# Patient Record
Sex: Male | Born: 1980 | Race: Black or African American | Hispanic: No | Smoking: Current every day smoker
Health system: Southern US, Community
[De-identification: ages and names within clinical notes are randomized; demographics above are authoritative.]

## PROBLEM LIST (undated history)

## (undated) DIAGNOSIS — J40 Bronchitis, not specified as acute or chronic: Secondary | ICD-10-CM

---

## 2001-11-14 ENCOUNTER — Emergency Department (HOSPITAL_COMMUNITY): Admission: EM | Admit: 2001-11-14 | Discharge: 2001-11-15 | Payer: Self-pay | Admitting: *Deleted

## 2001-11-15 ENCOUNTER — Encounter: Payer: Self-pay | Admitting: *Deleted

## 2001-11-15 ENCOUNTER — Ambulatory Visit (HOSPITAL_COMMUNITY): Admission: RE | Admit: 2001-11-15 | Discharge: 2001-11-15 | Payer: Self-pay | Admitting: Emergency Medicine

## 2001-11-15 ENCOUNTER — Encounter: Payer: Self-pay | Admitting: Emergency Medicine

## 2015-08-15 ENCOUNTER — Emergency Department (HOSPITAL_COMMUNITY)
Admission: EM | Admit: 2015-08-15 | Discharge: 2015-08-15 | Disposition: A | Discharge status: Home / Self Care | Payer: Self-pay | Attending: Emergency Medicine | Admitting: Emergency Medicine

## 2015-08-15 ENCOUNTER — Encounter (HOSPITAL_COMMUNITY): Payer: Self-pay | Admitting: Emergency Medicine

## 2015-08-15 DIAGNOSIS — A6000 Herpesviral infection of urogenital system, unspecified: Secondary | ICD-10-CM | POA: Insufficient documentation

## 2015-08-15 DIAGNOSIS — Z720 Tobacco use: Secondary | ICD-10-CM | POA: Insufficient documentation

## 2015-08-15 MED ORDER — ACYCLOVIR 400 MG PO TABS: 400.0000 mg | ORAL_TABLET | Freq: Three times a day (TID) | ORAL | Status: AC

## 2015-08-15 MED ORDER — TRAMADOL HCL 50 MG PO TABS: 50.0000 mg | ORAL_TABLET | Freq: Four times a day (QID) | ORAL | Status: DC | PRN

## 2015-08-15 MED ORDER — IBUPROFEN 600 MG PO TABS: 600.0000 mg | ORAL_TABLET | Freq: Four times a day (QID) | ORAL | Status: DC | PRN

## 2015-08-15 NOTE — ED Notes (Signed)
Pt. requesting STD screening reports painful skin "sores" at penis onset 3 days ago , denies dysuria or penile discharge.

## 2015-08-15 NOTE — ED Provider Notes (Signed)
CSN: [id number]     Arrival date & time 08/15/15  0110 History   First MD Initiated Contact with Patient 08/15/15 0129     Chief Complaint  Patient presents with  . Exposure to STD     (Consider location/radiation/quality/duration/timing/severity/associated sxs/prior Treatment) HPI Patient presents with painful lesions to the penis for the past 3 days. States they appeared to days after having unprotected sex with a new sexual partner. Initially started as blisters and then became ulcerated. Denies any fever or chills. No testicular pain or masses. No penile discharge. Denies any abdominal pain. No dysuria, frequency, hematuria or urgency. No previously diagnosed sexually transmitted diseases. History reviewed. No pertinent past medical history. No past surgical history on file. No family history on file. Social History  Substance Use Topics  . Smoking status: Current Every Day Smoker  . Smokeless tobacco: None  . Alcohol Use: Yes    Review of Systems  Constitutional: Negative for fever and chills.  Gastrointestinal: Negative for nausea, vomiting and abdominal pain.  Genitourinary: Positive for penile pain. Negative for dysuria, frequency, hematuria, flank pain, discharge, scrotal swelling and testicular pain.  Skin: Positive for rash.  All other systems reviewed and are negative.     Allergies  Review of patient's allergies indicates no known allergies.  Home Medications   Prior to Admission medications   Medication Sig Start Date End Date Taking? Authorizing Provider  acyclovir (ZOVIRAX) 400 MG tablet Take 1 tablet (400 mg total) by mouth 3 (three) times daily. 08/15/15 08/25/15  Loren Racer, MD  ibuprofen (ADVIL,MOTRIN) 600 MG tablet Take 1 tablet (600 mg total) by mouth every 6 (six) hours as needed. 08/15/15   Loren Racer, MD  traMADol (ULTRAM) 50 MG tablet Take 1 tablet (50 mg total) by mouth every 6 (six) hours as needed. 08/15/15   Loren Racer, MD   BP  130/78 mmHg  Pulse 82  Temp(Src) 98.3 F (36.8 C) (Oral)  Resp 14  SpO2 97% Physical Exam  Constitutional: He is oriented to person, place, and time. He appears well-developed and well-nourished. No distress.  HENT:  Head: Normocephalic and atraumatic.  Mouth/Throat: Oropharynx is clear and moist.  Eyes: EOM are normal. Pupils are equal, round, and reactive to light.  Neck: Normal range of motion. Neck supple.  Cardiovascular: Normal rate and regular rhythm.   Pulmonary/Chest: Effort normal and breath sounds normal.  Abdominal: Soft. Bowel sounds are normal. He exhibits no distension and no mass. There is no tenderness. There is no rebound and no guarding.  Genitourinary:  Circumcised male with multiple ulcerated lesions to the glans and shaft of the penis. There is no penile discharge. No testicular masses or tenderness.  Musculoskeletal: Normal range of motion. He exhibits no edema or tenderness.  Neurological: He is alert and oriented to person, place, and time.  Skin: Skin is warm and dry. No rash noted. No erythema.  Psychiatric: He has a normal mood and affect. His behavior is normal.  Nursing note and vitals reviewed.   ED Course  Procedures (including critical care time) Labs Review Labs Reviewed - No data to display  Imaging Review No results found. I have personally reviewed and evaluated these images and lab results as part of my medical decision-making.   EKG Interpretation None      MDM   Final diagnoses:  Herpes genitalis    Physical exam consistent with herpes genitalis. Will start on acyclovir. He is advised to follow-up with health Department and have  all sexual partners seen and treated.    Loren Racer, MD 08/15/15 [phone]

## 2015-08-15 NOTE — Discharge Instructions (Signed)
Genital Herpes °Genital herpes is a sexually transmitted disease. This means that it is a disease passed by having sex with an infected person. There is no cure for genital herpes. The time between attacks can be months to years. The virus may live in a person but produce no problems (symptoms). This infection can be passed to a baby as it travels down the birth canal (vagina). In a newborn, this can cause central nervous system damage, eye damage, or even death. The virus that causes genital herpes is usually HSV-2 virus. The virus that causes oral herpes is usually HSV-1. The diagnosis (learning what is wrong) is made through culture results. °SYMPTOMS  °Usually symptoms of pain and itching begin a few days to a week after contact. It first appears as small blisters that progress to small painful ulcers which then scab over and heal after several days. It affects the outer genitalia, birth canal, cervix, penis, anal area, buttocks, and thighs. °HOME CARE INSTRUCTIONS  °· Keep ulcerated areas dry and clean. °· Take medications as directed. Antiviral medications can speed up healing. They will not prevent recurrences or cure this infection. These medications can also be taken for suppression if there are frequent recurrences. °· While the infection is active, it is contagious. Avoid all sexual contact during active infections. °· Condoms may help prevent spread of the herpes virus. °· Practice safe sex. °· Wash your hands thoroughly after touching the genital area. °· Avoid touching your eyes after touching your genital area. °· Inform your caregiver if you have had genital herpes and become pregnant. It is your responsibility to insure a safe outcome for your baby in this pregnancy. °· Only take over-the-counter or prescription medicines for pain, discomfort, or fever as directed by your caregiver. °SEEK MEDICAL CARE IF:  °· You have a recurrence of this infection. °· You do not respond to medications and are not  improving. °· You have new sources of pain or discharge which have changed from the original infection. °· You have an oral temperature above 102° F (38.9° C). °· You develop abdominal pain. °· You develop eye pain or signs of eye infection. °Document Released: 11/12/2000 Document Revised: 02/07/2012 Document Reviewed: 12/03/2009 °ExitCare® Patient Information ©2015 [employer]. This information is not intended to replace advice given to you by your health care provider. Make sure you discuss any questions you have with your health care provider. ° °

## 2016-04-21 ENCOUNTER — Encounter (HOSPITAL_COMMUNITY): Payer: Self-pay | Admitting: *Deleted

## 2016-04-21 ENCOUNTER — Emergency Department (HOSPITAL_COMMUNITY)
Admission: EM | Admit: 2016-04-21 | Discharge: 2016-04-21 | Disposition: A | Discharge status: Home / Self Care | Payer: Self-pay | Attending: Emergency Medicine | Admitting: Emergency Medicine

## 2016-04-21 DIAGNOSIS — Z791 Long term (current) use of non-steroidal anti-inflammatories (NSAID): Secondary | ICD-10-CM | POA: Insufficient documentation

## 2016-04-21 DIAGNOSIS — F17200 Nicotine dependence, unspecified, uncomplicated: Secondary | ICD-10-CM | POA: Insufficient documentation

## 2016-04-21 DIAGNOSIS — J020 Streptococcal pharyngitis: Secondary | ICD-10-CM | POA: Insufficient documentation

## 2016-04-21 LAB — RAPID STREP SCREEN (MED CTR MEBANE ONLY): Streptococcus, Group A Screen (Direct): POSITIVE — AB

## 2016-04-21 MED ORDER — AMOXICILLIN 250 MG PO CAPS
500.0000 mg | ORAL_CAPSULE | Freq: Once | ORAL | Status: AC
  Administered 2016-04-21: 500 mg via ORAL
  Filled 2016-04-21: qty 2

## 2016-04-21 MED ORDER — HYDROCODONE-ACETAMINOPHEN 5-325 MG PO TABS: 1.0000 | ORAL_TABLET | ORAL | Status: DC | PRN

## 2016-04-21 MED ORDER — ONDANSETRON HCL 4 MG PO TABS
4.0000 mg | ORAL_TABLET | Freq: Once | ORAL | Status: AC
  Administered 2016-04-21: 4 mg via ORAL
  Filled 2016-04-21: qty 1

## 2016-04-21 MED ORDER — AMOXICILLIN 500 MG PO CAPS: 500.0000 mg | ORAL_CAPSULE | Freq: Three times a day (TID) | ORAL | Status: DC

## 2016-04-21 MED ORDER — PREDNISONE 50 MG PO TABS
60.0000 mg | ORAL_TABLET | Freq: Once | ORAL | Status: AC
  Administered 2016-04-21: 60 mg via ORAL
  Filled 2016-04-21: qty 1

## 2016-04-21 MED ORDER — DEXAMETHASONE 4 MG PO TABS: 4.0000 mg | ORAL_TABLET | Freq: Two times a day (BID) | ORAL | Status: DC

## 2016-04-21 MED ORDER — HYDROCODONE-ACETAMINOPHEN 5-325 MG PO TABS
2.0000 | ORAL_TABLET | Freq: Once | ORAL | Status: AC
  Administered 2016-04-21: 2 via ORAL
  Filled 2016-04-21: qty 2

## 2016-04-21 NOTE — ED Provider Notes (Signed)
CSN: [id number]     Arrival date & time 04/21/16  1914 History   First MD Initiated Contact with Patient 04/21/16 2001     Chief Complaint  Patient presents with  . Sore Throat     (Consider location/radiation/quality/duration/timing/severity/associated sxs/prior Treatment) Patient is a 35 y.o. male presenting with pharyngitis. The history is provided by the patient.  Sore Throat This is a new problem. The current episode started in the past 7 days. The problem occurs intermittently. The problem has been gradually worsening. Associated symptoms include congestion, coughing, fatigue and a sore throat. Pertinent negatives include no rash. The symptoms are aggravated by swallowing. He has tried acetaminophen, ice and rest for the symptoms. The treatment provided no relief.    History reviewed. No pertinent past medical history. History reviewed. No pertinent past surgical history. History reviewed. No pertinent family history. Social History  Substance Use Topics  . Smoking status: Current Every Day Smoker -- 0.50 packs/day  . Smokeless tobacco: None  . Alcohol Use: Yes     Comment: occasionally    Review of Systems  Constitutional: Positive for appetite change and fatigue.  HENT: Positive for congestion and sore throat.   Respiratory: Positive for cough.   Skin: Negative for rash.  All other systems reviewed and are negative.     Allergies  Review of patient's allergies indicates no known allergies.  Home Medications   Prior to Admission medications   Medication Sig Start Date End Date Taking? Authorizing Provider  ibuprofen (ADVIL,MOTRIN) 600 MG tablet Take 1 tablet (600 mg total) by mouth every 6 (six) hours as needed. 08/15/15   Loren Racer, MD  traMADol (ULTRAM) 50 MG tablet Take 1 tablet (50 mg total) by mouth every 6 (six) hours as needed. 08/15/15   Loren Racer, MD   BP 126/68 mmHg  Pulse 70  Temp(Src) 98.6 F (37 C) (Oral)  Resp 18  Ht 6' (1.829 m)  Wt  89.812 kg  BMI 26.85 kg/m2  SpO2 98% Physical Exam  Constitutional: He is oriented to person, place, and time. He appears well-developed and well-nourished.  Non-toxic appearance.  HENT:  Head: Normocephalic.  Right Ear: Tympanic membrane and external ear normal.  Left Ear: Tympanic membrane and external ear normal.  The uvula is enlarged. There is increased redness of the posterior pharynx. The airway is patent.   Eyes: EOM and lids are normal. Pupils are equal, round, and reactive to light.  Neck: Normal range of motion. Neck supple. Carotid bruit is not present.  Cardiovascular: Normal rate, regular rhythm, normal heart sounds, intact distal pulses and normal pulses.   Pulmonary/Chest: Breath sounds normal. No respiratory distress.  Abdominal: Soft. Bowel sounds are normal. There is no tenderness. There is no guarding.  Musculoskeletal: Normal range of motion.  Lymphadenopathy:       Head (right side): No submandibular adenopathy present.       Head (left side): No submandibular adenopathy present.    He has no cervical adenopathy.  Neurological: He is alert and oriented to person, place, and time. He has normal strength. No cranial nerve deficit or sensory deficit.  Skin: Skin is warm and dry.  Psychiatric: He has a normal mood and affect. His speech is normal.  Nursing note and vitals reviewed.   ED Course  Procedures (including critical care time) Labs Review Labs Reviewed  RAPID STREP SCREEN (NOT AT [facility]) - Abnormal; Notable for the following:    Streptococcus, Group A Screen (Direct) POSITIVE (*)  All other components within normal limits    Imaging Review No results found. I have personally reviewed and evaluated these images and lab results as part of my medical decision-making.   EKG Interpretation None      MDM  Vital signs are well within normal limits. Pulse oximetry is 98% on room air. Strep test is positive. The patient was given a choice of treatment  with Bicillin versus oral medication. The patient elected for oral medication. Patient will be treated with Amoxil, Decadron, and Norco. Patient understands the need for good hand washing and using his mask until symptoms have resolved. Patient in agreement with the discharge plan.    Final diagnoses:  None    **I have reviewed nursing notes, vital signs, and all appropriate lab and imaging results for this patient.Ivery Quale*    Melony Tenpas, PA-C 04/24/16 2236  Bethann BerkshireJoseph Zammit, MD 04/25/16 [phone]

## 2016-04-21 NOTE — Discharge Instructions (Signed)
Your strep test is positive. Please use your mask. Wash hands frequently. Salt water gargles every 2 hours as needed. Chloraseptic spray may be helpful with your discomfort. Please use 600 mg of ibuprofen every 6 hours. Amoxil 3 times daily with food. Decadron 2 times daily with a meal. Use Norco for more severe pain. This medication may cause drowsiness, please use with caution. Strep Throat Strep throat is a bacterial infection of the throat. Your health care provider may call the infection tonsillitis or pharyngitis, depending on whether there is swelling in the tonsils or at the back of the throat. Strep throat is most common during the cold months of the year in children who are 365-35 years of age, but it can happen during any season in people of any age. This infection is spread from person to person (contagious) through coughing, sneezing, or close contact. CAUSES Strep throat is caused by the bacteria called Streptococcus pyogenes. RISK FACTORS This condition is more likely to develop in:  People who spend time in crowded places where the infection can spread easily.  People who have close contact with someone who has strep throat. SYMPTOMS Symptoms of this condition include:  Fever or chills.   Redness, swelling, or pain in the tonsils or throat.  Pain or difficulty when swallowing.  White or yellow spots on the tonsils or throat.  Swollen, tender glands in the neck or under the jaw.  Red rash all over the body (rare). DIAGNOSIS This condition is diagnosed by performing a rapid strep test or by taking a swab of your throat (throat culture test). Results from a rapid strep test are usually ready in a few minutes, but throat culture test results are available after one or two days. TREATMENT This condition is treated with antibiotic medicine. HOME CARE INSTRUCTIONS Medicines  Take over-the-counter and prescription medicines only as told by your health care provider.  Take  your antibiotic as told by your health care provider. Do not stop taking the antibiotic even if you start to feel better.  Have family members who also have a sore throat or fever tested for strep throat. They may need antibiotics if they have the strep infection. Eating and Drinking  Do not share food, drinking cups, or personal items that could cause the infection to spread to other people.  If swallowing is difficult, try eating soft foods until your sore throat feels better.  Drink enough fluid to keep your urine clear or pale yellow. General Instructions  Gargle with a salt-water mixture 3-4 times per day or as needed. To make a salt-water mixture, completely dissolve -1 tsp of salt in 1 cup of warm water.  Make sure that all household members wash their hands well.  Get plenty of rest.  Stay home from school or work until you have been taking antibiotics for 24 hours.  Keep all follow-up visits as told by your health care provider. This is important. SEEK MEDICAL CARE IF:  The glands in your neck continue to get bigger.  You develop a rash, cough, or earache.  You cough up a thick liquid that is green, yellow-brown, or bloody.  You have pain or discomfort that does not get better with medicine.  Your problems seem to be getting worse rather than better.  You have a fever. SEEK IMMEDIATE MEDICAL CARE IF:  You have new symptoms, such as vomiting, severe headache, stiff or painful neck, chest pain, or shortness of breath.  You have severe throat  pain, drooling, or changes in your voice.  You have swelling of the neck, or the skin on the neck becomes red and tender.  You have signs of dehydration, such as fatigue, dry mouth, and decreased urination.  You become increasingly sleepy, or you cannot wake up completely.  Your joints become red or painful.   This information is not intended to replace advice given to you by your health care provider. Make sure you discuss  any questions you have with your health care provider.   Document Released: 11/12/2000 Document Revised: 08/06/2015 Document Reviewed: 03/10/2015 Elsevier Interactive Patient Education [employer]

## 2016-04-21 NOTE — ED Notes (Signed)
Pt c/o sore throat and cough x 1 week; pt denies any fever

## 2016-05-26 ENCOUNTER — Emergency Department (HOSPITAL_COMMUNITY)
Admission: EM | Admit: 2016-05-26 | Discharge: 2016-05-26 | Disposition: A | Discharge status: Home / Self Care | Payer: Worker's Compensation | Attending: Emergency Medicine | Admitting: Emergency Medicine

## 2016-05-26 ENCOUNTER — Encounter (HOSPITAL_COMMUNITY): Payer: Self-pay | Admitting: Emergency Medicine

## 2016-05-26 ENCOUNTER — Emergency Department (HOSPITAL_COMMUNITY): Payer: Worker's Compensation

## 2016-05-26 DIAGNOSIS — Z79899 Other long term (current) drug therapy: Secondary | ICD-10-CM | POA: Insufficient documentation

## 2016-05-26 DIAGNOSIS — Y990 Civilian activity done for income or pay: Secondary | ICD-10-CM | POA: Insufficient documentation

## 2016-05-26 DIAGNOSIS — Y939 Activity, unspecified: Secondary | ICD-10-CM | POA: Insufficient documentation

## 2016-05-26 DIAGNOSIS — Y929 Unspecified place or not applicable: Secondary | ICD-10-CM | POA: Insufficient documentation

## 2016-05-26 DIAGNOSIS — F17200 Nicotine dependence, unspecified, uncomplicated: Secondary | ICD-10-CM | POA: Insufficient documentation

## 2016-05-26 DIAGNOSIS — Z792 Long term (current) use of antibiotics: Secondary | ICD-10-CM | POA: Insufficient documentation

## 2016-05-26 DIAGNOSIS — S20212A Contusion of left front wall of thorax, initial encounter: Secondary | ICD-10-CM

## 2016-05-26 NOTE — ED Provider Notes (Signed)
CSN: [id number]     Arrival date & time 05/26/16  1030 History  By signing my name below, I, Tyrone Singleton, attest that this documentation has been prepared under the direction and in the presence of Tyrone OctaveStephen Yareth Kearse, MD.   Electronically Signed: Iona Beardhristian Singleton, ED Scribe. 05/26/2016. 12:11 PM   Chief Complaint  Patient presents with  . Chest Injury    The history is provided by the patient. No language interpreter was used.   HPI Comments: Tyrone Singleton is a 35 y.o. male who presents to the Emergency Department complaining of sudden onset, non-radiating chest injury s/p fall this morning at 9:30 AM when he fell off a forklift from a height of about 35 feet. Pt states the harness he was wearing stopped him 5 feet from the ground. He did not hit his head or any part of his body on the ground or any other objects. Pt's only complaint is mild, bilateral, lower chest pain wear the harness was located. He states that he was required by work to come to the ED for evaluation. No other associated symptoms noted. His pain is worse with palpation. No other worsening or alleviating factors noted. Pt denies shortness of breath, back pain, abdominal pain, neck pain, dizziness, light-headedness, or any other pertinent symptoms.  History reviewed. No pertinent past medical history. History reviewed. No pertinent past surgical history. No family history on file. Social History  Substance Use Topics  . Smoking status: Current Every Day Smoker -- 0.50 packs/day  . Smokeless tobacco: None  . Alcohol Use: Yes     Comment: occasionally    Review of Systems A complete 10 system review of systems was obtained and all systems are negative except as noted in the HPI and PMH.    Allergies  Review of patient's allergies indicates no known allergies.  Home Medications   Prior to Admission medications   Medication Sig Start Date End Date Taking? Authorizing Provider  amoxicillin (AMOXIL) 500 MG  capsule Take 1 capsule (500 mg total) by mouth 3 (three) times daily. 04/21/16   Ivery QualeHobson Bryant, PA-C  dexamethasone (DECADRON) 4 MG tablet Take 1 tablet (4 mg total) by mouth 2 (two) times daily with a meal. 04/21/16   Ivery QualeHobson Bryant, PA-C  HYDROcodone-acetaminophen (NORCO/VICODIN) 5-325 MG tablet Take 1 tablet by mouth every 4 (four) hours as needed. 04/21/16   Ivery QualeHobson Bryant, PA-C  ibuprofen (ADVIL,MOTRIN) 600 MG tablet Take 1 tablet (600 mg total) by mouth every 6 (six) hours as needed. 08/15/15   Loren Raceravid Yelverton, MD  traMADol (ULTRAM) 50 MG tablet Take 1 tablet (50 mg total) by mouth every 6 (six) hours as needed. 08/15/15   Loren Raceravid Yelverton, MD   BP 139/85 mmHg  Pulse 73  Temp(Src) 98.1 F (36.7 C) (Oral)  Resp 14  Ht 6' (1.829 m)  Wt 197 lb (89.359 kg)  BMI 26.71 kg/m2  SpO2 98% Physical Exam  Constitutional: He is oriented to person, place, and time. He appears well-developed and well-nourished. No distress.  HENT:  Head: Normocephalic and atraumatic.  Mouth/Throat: Oropharynx is clear and moist. No oropharyngeal exudate.  Eyes: Conjunctivae and EOM are normal. Pupils are equal, round, and reactive to light.  Neck: Normal range of motion. Neck supple.  No meningismus.  Cardiovascular: Normal rate, regular rhythm, normal heart sounds and intact distal pulses.   No murmur heard. Pulmonary/Chest: Effort normal and breath sounds normal. No respiratory distress. He exhibits tenderness.  Tenderness to left lateral chest. No crepitance.  No ecchymosis. Equal breath sounds.  Abdominal: Soft. There is no tenderness. There is no rebound and no guarding.  Musculoskeletal: Normal range of motion. He exhibits no edema or tenderness.  No cervical, thoracic, or lumbar tenderness. No pain with ROM of hips.  Neurological: He is alert and oriented to person, place, and time. No cranial nerve deficit. He exhibits normal muscle tone. Coordination normal.  No ataxia on finger to nose bilaterally. No pronator  drift. 5/5 strength throughout. CN 2-12 intact.Equal grip strength. Sensation intact.   Skin: Skin is warm.  Psychiatric: He has a normal mood and affect. His behavior is normal.  Nursing note and vitals reviewed.   ED Course  Procedures (including critical care time) DIAGNOSTIC STUDIES: Oxygen Saturation is 98% on RA, normal by my interpretation.    COORDINATION OF CARE: 11:25 AM Discussed treatment plan which includes CXR with pt at bedside and pt agreed to plan.  Labs Review Labs Reviewed - No data to display  Imaging Review Dg Chest 2 View  05/26/2016  CLINICAL DATA:  Recent fall or work with chest pain, initial encounter EXAM: CHEST  2 VIEW COMPARISON:  None. FINDINGS: The heart size and mediastinal contours are within normal limits. Both lungs are clear. The visualized skeletal structures are unremarkable. IMPRESSION: No active cardiopulmonary disease. Electronically Signed   By: Alcide CleverMark  Lukens M.D.   On: 05/26/2016 11:44   I have personally reviewed and evaluated these images as part of my medical decision-making.   EKG Interpretation None      MDM   Final diagnoses:  Chest wall contusion, left, initial encounter   Patient fell from forklift while wearing safety harness.  Did not hit ground. Did not hit head or LOC.  No CT or L spine tenderness Neuro intact.intact radial pulses.  CXR negative. No rib fracture or pneumothorax.  Appears well. No other injuries.  Anxious to leave. Will treat supportively with NSAIDs. Wants to return to work immediately.  Told should followup for recheck by PCP before returning to work. Return precautions discussed.    I personally performed the services described in this documentation, which was scribed in my presence. The recorded information has been reviewed and is accurate.    Tyrone OctaveStephen Aubrey Blackard, MD 05/26/16 1843

## 2016-05-26 NOTE — ED Notes (Signed)
RN went in to discharge pt and was discussing information for pt to follow up with free clinic/health department. Pt got upset stating, "This was a waste of my time, you can just keep all of that information" and left ED without signing paperwork or having discharge vitals taken.

## 2016-05-26 NOTE — ED Notes (Signed)
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 States his harness caught him. Pt reports bilateral lower chest pain that worsens with palpation. Pt denies any SOB. NAD noted.

## 2016-05-26 NOTE — Discharge Instructions (Signed)
Chest Contusion A chest contusion is a deep bruise on your chest area. Contusions are the result of an injury that caused bleeding under the skin. A chest contusion may involve bruising of the skin, muscles, or ribs. The contusion may turn blue, purple, or yellow. Minor injuries will give you a painless contusion, but more severe contusions may stay painful and swollen for a few weeks. CAUSES  A contusion is usually caused by a blow, trauma, or direct force to an area of the body. SYMPTOMS   Swelling and redness of the injured area.  Discoloration of the injured area.  Tenderness and soreness of the injured area.  Pain. DIAGNOSIS  The diagnosis can be made by taking a history and performing a physical exam. An X-ray, CT scan, or MRI may be needed to determine if there were any associated injuries, such as broken bones (fractures) or internal injuries. TREATMENT  Often, the best treatment for a chest contusion is resting, icing, and applying cold compresses to the injured area. Deep breathing exercises may be recommended to reduce the risk of pneumonia. Over-the-counter medicines may also be recommended for pain control. HOME CARE INSTRUCTIONS   Put ice on the injured area.  Put ice in a plastic bag.  Place a towel between your skin and the bag.  Leave the ice on for 15-20 minutes, 03-04 times a day.  Only take over-the-counter or prescription medicines as directed by your caregiver. Your caregiver may recommend avoiding anti-inflammatory medicines (aspirin, ibuprofen, and naproxen) for 48 hours because these medicines may increase bruising.  Rest the injured area.  Perform deep-breathing exercises as directed by your caregiver.  Stop smoking if you smoke.  Do not lift objects over 5 pounds (2.3 kg) for 3 days or longer if recommended by your caregiver. SEEK IMMEDIATE MEDICAL CARE IF:   You have increased bruising or swelling.  You have pain that is getting worse.  You have  difficulty breathing.  You have dizziness, weakness, or fainting.  You have blood in your urine or stool.  You cough up or vomit blood.  Your swelling or pain is not relieved with medicines. MAKE SURE YOU:   Understand these instructions.  Will watch your condition.  Will get help right away if you are not doing well or get worse.   This information is not intended to replace advice given to you by your health care provider. Make sure you discuss any questions you have with your health care provider.   Document Released: 08/10/2001 Document Revised: 08/09/2012 Document Reviewed: 05/08/2012 Elsevier Interactive Patient Education 2016 Elsevier Inc.  

## 2016-05-27 ENCOUNTER — Emergency Department (HOSPITAL_COMMUNITY)
Admission: EM | Admit: 2016-05-27 | Discharge: 2016-05-27 | Disposition: A | Discharge status: Home / Self Care | Payer: Self-pay | Attending: Dermatology | Admitting: Dermatology

## 2016-05-27 ENCOUNTER — Encounter (HOSPITAL_COMMUNITY): Payer: Self-pay

## 2016-05-27 DIAGNOSIS — R1110 Vomiting, unspecified: Secondary | ICD-10-CM | POA: Insufficient documentation

## 2016-05-27 DIAGNOSIS — Z5321 Procedure and treatment not carried out due to patient leaving prior to being seen by health care provider: Secondary | ICD-10-CM | POA: Insufficient documentation

## 2016-05-27 LAB — CBC
HEMATOCRIT: 44.6 % (ref 39.0–52.0)
Hemoglobin: 15.2 g/dL (ref 13.0–17.0)
MCH: 28.4 pg (ref 26.0–34.0)
MCHC: 34.1 g/dL (ref 30.0–36.0)
MCV: 83.2 fL (ref 78.0–100.0)
Platelets: 234 10*3/uL (ref 150–400)
RBC: 5.36 MIL/uL (ref 4.22–5.81)
RDW: 14.3 % (ref 11.5–15.5)
WBC: 8.6 10*3/uL (ref 4.0–10.5)

## 2016-05-27 LAB — COMPREHENSIVE METABOLIC PANEL
ALK PHOS: 68 U/L (ref 38–126)
ALT: 20 U/L (ref 17–63)
AST: 25 U/L (ref 15–41)
Albumin: 3.8 g/dL (ref 3.5–5.0)
Anion gap: 7 (ref 5–15)
BILIRUBIN TOTAL: 0.9 mg/dL (ref 0.3–1.2)
BUN: 6 mg/dL (ref 6–20)
CALCIUM: 9 mg/dL (ref 8.9–10.3)
CO2: 23 mmol/L (ref 22–32)
CREATININE: 0.94 mg/dL (ref 0.61–1.24)
Chloride: 109 mmol/L (ref 101–111)
GFR calc Af Amer: >60 mL/min (ref >60)
GLUCOSE: 104 mg/dL — AB (ref 65–99)
Potassium: 3.5 mmol/L (ref 3.5–5.1)
Sodium: 139 mmol/L (ref 135–145)
TOTAL PROTEIN: 5.8 g/dL — AB (ref 6.5–8.1)

## 2016-05-27 LAB — LIPASE, BLOOD: Lipase: 19 U/L (ref 11–51)

## 2016-05-27 NOTE — ED Notes (Signed)
Pt leaving pt says " I'm going to get something to eat

## 2016-05-27 NOTE — ED Notes (Signed)
Per pt, Pt is coming from work and reports having two episodes of vomiting. Reports drinking alcohol and eating last night.

## 2017-05-29 ENCOUNTER — Emergency Department (HOSPITAL_COMMUNITY)
Admission: EM | Admit: 2017-05-29 | Discharge: 2017-05-29 | Disposition: A | Discharge status: Home / Self Care | Payer: Self-pay | Attending: Emergency Medicine | Admitting: Emergency Medicine

## 2017-05-29 ENCOUNTER — Emergency Department (HOSPITAL_COMMUNITY): Payer: Self-pay

## 2017-05-29 ENCOUNTER — Encounter (HOSPITAL_COMMUNITY): Payer: Self-pay | Admitting: *Deleted

## 2017-05-29 DIAGNOSIS — F17200 Nicotine dependence, unspecified, uncomplicated: Secondary | ICD-10-CM | POA: Insufficient documentation

## 2017-05-29 DIAGNOSIS — J4521 Mild intermittent asthma with (acute) exacerbation: Secondary | ICD-10-CM | POA: Insufficient documentation

## 2017-05-29 MED ORDER — PREDNISONE 10 MG PO TABS
60.0000 mg | ORAL_TABLET | Freq: Once | ORAL | Status: AC
  Administered 2017-05-29: 60 mg via ORAL
  Filled 2017-05-29: qty 1

## 2017-05-29 MED ORDER — PREDNISONE 10 MG PO TABS: ORAL_TABLET | ORAL | 0 refills | Status: AC

## 2017-05-29 MED ORDER — ALBUTEROL SULFATE HFA 108 (90 BASE) MCG/ACT IN AERS
2.0000 | INHALATION_SPRAY | Freq: Once | RESPIRATORY_TRACT | Status: AC
  Administered 2017-05-29: 2 via RESPIRATORY_TRACT
  Filled 2017-05-29: qty 6.7

## 2017-05-29 NOTE — Discharge Instructions (Signed)
Take your next dose of prednisone tomorrow evening.  Take 2 puffs of your inhaler every 4 hours if you are coughing or wheezing.

## 2017-05-29 NOTE — ED Triage Notes (Signed)
Pt with productive cough for over a week, denies fever but admits to having chills earlier.  Denies pain at present

## 2017-05-30 NOTE — ED Provider Notes (Signed)
[facility]EMERGENCY DEPT Provider Note   CSN: [id number] Arrival date & time: 05/29/17  1258     History   Chief Complaint Chief Complaint  Patient presents with  . Cough    HPI Tyrone Singleton is a 36 y.o. male with a history of childhood asthma and episodes of acute bronchitis as an adult presenting with cough which has been productive of a clear sputum similar to nasal congestion and drainage in association with shortness of breath and wheezing.  He denies fevers, chills, sore throat, ear or sinus pain, chest pain, n/v, dizziness or headache.  HPI  History reviewed. No pertinent past medical history.  There are no active problems to display for this patient.   History reviewed. No pertinent surgical history.     Home Medications    Prior to Admission medications   Medication Sig Start Date End Date Taking? Authorizing Provider  predniSONE (DELTASONE) 10 MG tablet Take 6 tablets daily for 3 additional days. 05/30/17   Burgess Amor, PA-C    Family History History reviewed. No pertinent family history.  Social History Social History  Substance Use Topics  . Smoking status: Current Every Day Smoker    Packs/day: 0.50  . Smokeless tobacco: Never Used  . Alcohol use Yes     Comment: occasionally     Allergies   Patient has no known allergies.   Review of Systems Review of Systems  Constitutional: Negative for fever.  HENT: Positive for congestion and rhinorrhea. Negative for ear pain, facial swelling, sinus pressure and sore throat.   Eyes: Negative.   Respiratory: Positive for cough, shortness of breath and wheezing. Negative for chest tightness.   Cardiovascular: Negative for chest pain.  Gastrointestinal: Negative for abdominal pain, nausea and vomiting.  Genitourinary: Negative.   Musculoskeletal: Negative for arthralgias, joint swelling and neck pain.  Skin: Negative.  Negative for rash and wound.  Neurological: Negative for dizziness, weakness,  light-headedness, numbness and headaches.  Psychiatric/Behavioral: Negative.      Physical Exam Updated Vital Signs BP 129/73 (BP Location: Right Arm)   Pulse 83   Temp 98.3 F (36.8 C) (Oral)   Resp 16   Ht 6' (1.829 m)   Wt 83.9 kg (185 lb)   SpO2 95%   BMI 25.09 kg/m   Physical Exam  Constitutional: He appears well-developed and well-nourished.  HENT:  Head: Normocephalic and atraumatic.  Eyes: Conjunctivae are normal.  Neck: Normal range of motion.  Cardiovascular: Normal rate, regular rhythm, normal heart sounds and intact distal pulses.   Pulmonary/Chest: Effort normal. No respiratory distress. He has wheezes in the left upper field and the left middle field.  Faint left expiratory wheeze.  Otherwise good aeration,no accessory muscle use.  No respiratory distress.  Abdominal: Soft. Bowel sounds are normal. There is no tenderness.  Musculoskeletal: Normal range of motion.  Neurological: He is alert.  Skin: Skin is warm and dry.  Psychiatric: He has a normal mood and affect.  Nursing note and vitals reviewed.    ED Treatments / Results  Labs (all labs ordered are listed, but only abnormal results are displayed) Labs Reviewed - No data to display  EKG  EKG Interpretation None       Radiology Dg Chest 2 View  Result Date: 05/29/2017 CLINICAL DATA:  Productive cough.  No time course given. EXAM: CHEST  2 VIEW COMPARISON:  05/26/2016 FINDINGS: The heart size and mediastinal contours are within normal limits. Both lungs are clear. The visualized skeletal  structures are unremarkable. IMPRESSION: Normal chest x-ray Electronically Signed   By: Rudie MeyerP.  Gallerani M.D.   On: 05/29/2017 13:33    Procedures Procedures (including critical care time)  Medications Ordered in ED Medications  albuterol (PROVENTIL HFA;VENTOLIN HFA) 108 (90 Base) MCG/ACT inhaler 2 puff (2 puffs Inhalation Given 05/29/17 1559)  predniSONE (DELTASONE) tablet 60 mg (60 mg Oral Given 05/29/17 1613)      Initial Impression / Assessment and Plan / ED Course  I have reviewed the triage vital signs and the nursing notes.  Pertinent labs & imaging results that were available during my care of the patient were reviewed by me and considered in my medical decision making (see chart for details).     Patient was given an albuterol MDI with a spacer, 2 puffs with complete resolution of wheeze, patient felt improved.  He was also placed on a prednisone pulse dose, first dose was given here.  Plan when necessary follow-up.  He was given referrals to establish primary care physician here locally.  Patient was in no distress at time of discharge.  Final Clinical Impressions(s) / ED Diagnoses   Final diagnoses:  Mild intermittent asthma with exacerbation    New Prescriptions Discharge Medication List as of 05/29/2017  4:27 PM    START taking these medications   Details  predniSONE (DELTASONE) 10 MG tablet Take 6 tablets daily for 3 additional days., Print         Burgess Amordol, Hena Ewalt, PA-C 05/31/17 1457    Mesner, Barbara CowerJason, MD 06/02/17 [phone]

## 2017-11-27 ENCOUNTER — Other Ambulatory Visit: Payer: Self-pay

## 2017-11-27 ENCOUNTER — Emergency Department (HOSPITAL_COMMUNITY)
Admission: EM | Admit: 2017-11-27 | Discharge: 2017-11-27 | Disposition: A | Discharge status: Home / Self Care | Payer: Self-pay | Attending: Physician Assistant | Admitting: Physician Assistant

## 2017-11-27 ENCOUNTER — Encounter (HOSPITAL_COMMUNITY): Payer: Self-pay

## 2017-11-27 ENCOUNTER — Emergency Department (HOSPITAL_COMMUNITY): Payer: Self-pay

## 2017-11-27 DIAGNOSIS — J069 Acute upper respiratory infection, unspecified: Secondary | ICD-10-CM | POA: Insufficient documentation

## 2017-11-27 DIAGNOSIS — B9789 Other viral agents as the cause of diseases classified elsewhere: Secondary | ICD-10-CM | POA: Insufficient documentation

## 2017-11-27 DIAGNOSIS — F17210 Nicotine dependence, cigarettes, uncomplicated: Secondary | ICD-10-CM | POA: Insufficient documentation

## 2017-11-27 MED ORDER — BENZONATATE 100 MG PO CAPS: 100.0000 mg | ORAL_CAPSULE | Freq: Three times a day (TID) | ORAL | 0 refills | Status: AC | PRN

## 2017-11-27 NOTE — ED Triage Notes (Signed)
Pt reports nasal congestion and cough x 3 days. Reports bringing son in for similar symptoms ~ 1 week ago.

## 2017-11-27 NOTE — ED Provider Notes (Signed)
[facility]   CSN: [id number] Arrival date & time: 11/27/17  1414     History   Chief Complaint Chief Complaint  Patient presents with  . Cough    HPI Tyrone Singleton is a 36 y.o. male.  HPI   36 year old here with cough.  Patient had mild dry cough for the last 2 days.  Patient concerned because his son was recently diagnosed with RSV and is in the hospital currently.  His son is 513 months old.  Patient had dry cough wanted to be checked out.  No fevers.  No nausea no vomiting no other symptoms.  History reviewed. No pertinent past medical history.  There are no active problems to display for this patient.   History reviewed. No pertinent surgical history.     Home Medications    Prior to Admission medications   Medication Sig Start Date End Date Taking? Authorizing Provider  benzonatate (TESSALON PERLES) 100 MG capsule Take 1 capsule (100 mg total) by mouth 3 (three) times daily as needed for cough. 11/27/17   Ieasha Boerema Lyn, MD  predniSONE (DELTASONE) 10 MG tablet Take 6 tablets daily for 3 additional days. 05/30/17   Burgess AmorIdol, Julie, PA-C    Family History No family history on file.  Social History Social History   Tobacco Use  . Smoking status: Current Every Day Smoker    Packs/day: 0.50  . Smokeless tobacco: Never Used  Substance Use Topics  . Alcohol use: Yes    Comment: occasionally  . Drug use: No     Allergies   Patient has no known allergies.   Review of Systems Review of Systems  Constitutional: Negative for activity change, fatigue and fever.  Respiratory: Positive for cough. Negative for shortness of breath.   Cardiovascular: Negative for chest pain.  Gastrointestinal: Negative for abdominal pain.     Physical Exam Updated Vital Signs BP (!) 142/89 (BP Location: Right Arm)   Pulse 74   Temp 98.8 F (37.1 C) (Oral)   Resp 16   SpO2 99%   Physical Exam  Constitutional:  He is oriented to person, place, and time. He appears well-nourished.  HENT:  Head: Normocephalic and atraumatic.  Eyes: Conjunctivae are normal. Right eye exhibits no discharge. Left eye exhibits no discharge.  Cardiovascular: Normal rate.  Pulmonary/Chest: Effort normal and breath sounds normal. No respiratory distress. He has no wheezes. He has no rales.  Neurological: He is oriented to person, place, and time.  Skin: Skin is warm and dry. He is not diaphoretic.  Psychiatric: He has a normal mood and affect. His behavior is normal.  Nursing note and vitals reviewed.    ED Treatments / Results  Labs (all labs ordered are listed, but only abnormal results are displayed) Labs Reviewed - No data to display  EKG  EKG Interpretation None       Radiology Dg Chest 2 View  Result Date: 11/27/2017 CLINICAL DATA:  Pt reports nasal congestion and cough x 3 days. Reports bringing son in for similar symptoms APPROX. 1 week ago. Denies chest pain EXAM: CHEST  2 VIEW COMPARISON:  05/29/2017 FINDINGS: Midline trachea.  Normal heart size and mediastinal contours. Sharp costophrenic angles. No pneumothorax. Clear lungs. Mild hyperinflation. IMPRESSION: No active cardiopulmonary disease. Electronically Signed   By: Jeronimo GreavesKyle  Talbot M.D.   On: 11/27/2017 15:55    Procedures Procedures (including critical care time)  Medications Ordered in ED Medications - No data  to display   Initial Impression / Assessment and Plan / ED Course  I have reviewed the triage vital signs and the nursing notes.  Pertinent labs & imaging results that were available during my care of the patient were reviewed by me and considered in my medical decision making (see chart for details).      36 year old here with cough.  Patient had mild dry cough for the last 2 days.  Patient concerned because his son was recently diagnosed with RSV and is in the hospital currently.  His son is 593 months old.  Patient had dry cough  wanted to be checked out.  No fevers.  No nausea no vomiting no other symptoms.  4:09 PM X-ray negative.  I think this is likely viral.  Will give reassurance and have patient follow-up with PCP.    Final Clinical Impressions(s) / ED Diagnoses   Final diagnoses:  Viral URI with cough    ED Discharge Orders        Ordered    benzonatate (TESSALON PERLES) 100 MG capsule  3 times daily PRN     11/27/17 1608       Rinaldo Macqueen, Cindee Saltourteney Lyn, MD 11/27/17 1612

## 2019-08-07 ENCOUNTER — Other Ambulatory Visit: Payer: Self-pay

## 2019-08-07 DIAGNOSIS — Z20822 Contact with and (suspected) exposure to covid-19: Secondary | ICD-10-CM

## 2019-08-09 LAB — NOVEL CORONAVIRUS, NAA: SARS-CoV-2, NAA: NOT DETECTED

## 2019-09-26 ENCOUNTER — Other Ambulatory Visit: Payer: Self-pay | Admitting: *Deleted

## 2019-09-26 DIAGNOSIS — Z20822 Contact with and (suspected) exposure to covid-19: Secondary | ICD-10-CM

## 2019-09-27 LAB — NOVEL CORONAVIRUS, NAA: SARS-CoV-2, NAA: NOT DETECTED

## 2019-10-15 ENCOUNTER — Other Ambulatory Visit: Payer: Self-pay

## 2019-10-15 DIAGNOSIS — Z20822 Contact with and (suspected) exposure to covid-19: Secondary | ICD-10-CM

## 2019-10-16 LAB — NOVEL CORONAVIRUS, NAA: SARS-CoV-2, NAA: NOT DETECTED

## 2020-11-08 ENCOUNTER — Emergency Department (HOSPITAL_COMMUNITY): Payer: Self-pay

## 2020-11-08 ENCOUNTER — Emergency Department (HOSPITAL_COMMUNITY)
Admission: EM | Admit: 2020-11-08 | Discharge: 2020-11-08 | Disposition: A | Discharge status: Home / Self Care | Payer: Self-pay | Attending: Emergency Medicine | Admitting: Emergency Medicine

## 2020-11-08 ENCOUNTER — Encounter (HOSPITAL_COMMUNITY): Payer: Self-pay | Admitting: *Deleted

## 2020-11-08 ENCOUNTER — Other Ambulatory Visit: Payer: Self-pay

## 2020-11-08 DIAGNOSIS — M791 Myalgia, unspecified site: Secondary | ICD-10-CM | POA: Insufficient documentation

## 2020-11-08 DIAGNOSIS — Z20822 Contact with and (suspected) exposure to covid-19: Secondary | ICD-10-CM | POA: Insufficient documentation

## 2020-11-08 DIAGNOSIS — J181 Lobar pneumonia, unspecified organism: Secondary | ICD-10-CM | POA: Insufficient documentation

## 2020-11-08 DIAGNOSIS — J189 Pneumonia, unspecified organism: Secondary | ICD-10-CM

## 2020-11-08 DIAGNOSIS — F17200 Nicotine dependence, unspecified, uncomplicated: Secondary | ICD-10-CM | POA: Insufficient documentation

## 2020-11-08 HISTORY — DX: Bronchitis, not specified as acute or chronic: J40

## 2020-11-08 LAB — RESP PANEL BY RT-PCR (FLU A&B, COVID) ARPGX2
Influenza A by PCR: NEGATIVE
Influenza B by PCR: NEGATIVE
SARS Coronavirus 2 by RT PCR: NEGATIVE

## 2020-11-08 MED ORDER — AZITHROMYCIN 250 MG PO TABS: 250.0000 mg | ORAL_TABLET | Freq: Every day | ORAL | 0 refills | Status: AC

## 2020-11-08 MED ORDER — ALBUTEROL SULFATE HFA 108 (90 BASE) MCG/ACT IN AERS
1.0000 | INHALATION_SPRAY | Freq: Once | RESPIRATORY_TRACT | Status: AC
  Administered 2020-11-08: 1 via RESPIRATORY_TRACT
  Filled 2020-11-08: qty 6.7

## 2020-11-08 NOTE — ED Triage Notes (Signed)
Pt with cough x 7 days.  Pt states hx of bronchitis.  Fevers at home

## 2020-11-08 NOTE — ED Provider Notes (Signed)
[facility] EMERGENCY DEPARTMENT Provider Note   CSN: [id number] Arrival date & time: 11/08/20  1644     History Chief Complaint  Patient presents with  . Cough    Tyrone Singleton is a 39 y.o. male with pertinent past medical history of bronchitis that presents the emergency department today for cough.  Patient states that he had cough for the past 7 days with body aches and flulike symptoms.  States that he has had fevers at home. States that he has not been taking anything for this, states that he normally gets sick around this time of the year, does normally take albuterol which does help relieve his symptoms.  Denies any history of asthma.  Patient states that he is out of his albuterol inhaler.  Patient does smoke cigarettes daily.  Has had one dose of Covid vaccine 10 days ago, his symptoms started 3 days later.  Denies any sick contacts.  Denies any sore throat, congestion, facial pain.  Denies any shortness of breath or chest pain.  States that he was in his normal health before this.  States that he is generally healthy.  HPI     Past Medical History:  Diagnosis Date  . Bronchitis     There are no problems to display for this patient.   History reviewed. No pertinent surgical history.     History reviewed. No pertinent family history.  Social History   Tobacco Use  . Smoking status: Current Every Day Smoker    Packs/day: 0.50  . Smokeless tobacco: Never Used  Vaping Use  . Vaping Use: Never used  Substance Use Topics  . Alcohol use: Yes    Comment: occasionally  . Drug use: No    Home Medications Prior to Admission medications   Medication Sig Start Date End Date Taking? Authorizing Provider  azithromycin (ZITHROMAX) 250 MG tablet Take 1 tablet (250 mg total) by mouth daily. Take first 2 tablets together, then 1 every day until finished. 11/08/20   Farrel Gordon, PA-C  benzonatate (TESSALON PERLES) 100 MG capsule Take 1 capsule (100 mg total) by mouth 3  (three) times daily as needed for cough. 11/27/17   Mackuen, Courteney Lyn, MD  predniSONE (DELTASONE) 10 MG tablet Take 6 tablets daily for 3 additional days. 05/30/17   Burgess Amor, PA-C    Allergies    Patient has no known allergies.  Review of Systems   Review of Systems  Constitutional: Negative for chills, diaphoresis, fatigue and fever.  HENT: Negative for congestion, sore throat and trouble swallowing.   Eyes: Negative for pain and visual disturbance.  Respiratory: Positive for cough. Negative for shortness of breath and wheezing.   Cardiovascular: Negative for chest pain, palpitations and leg swelling.  Gastrointestinal: Negative for abdominal distention, abdominal pain, diarrhea, nausea and vomiting.  Genitourinary: Negative for difficulty urinating.  Musculoskeletal: Positive for arthralgias and myalgias. Negative for back pain, neck pain and neck stiffness.  Skin: Negative for pallor.  Neurological: Negative for dizziness, speech difficulty, weakness and headaches.  Psychiatric/Behavioral: Negative for confusion.    Physical Exam Updated Vital Signs BP 133/77 (BP Location: Right Arm)   Pulse 78   Temp 99.9 F (37.7 C) (Oral)   Resp 16   Ht 6' (1.829 m)   Wt 79.4 kg   SpO2 96%   BMI 23.73 kg/m   Physical Exam Constitutional:      General: He is not in acute distress.    Appearance: Normal appearance. He is  not ill-appearing, toxic-appearing or diaphoretic.     Comments: Patient without acute respiratory stress.  Patient is sitting comfortably in bed, no tripoding, use of accessory muscles.  Patient is speaking to me in full sentences.  Handling secretions well.  HENT:     Head: Normocephalic and atraumatic.     Jaw: There is normal jaw occlusion. No trismus, swelling or malocclusion.     Nose: No congestion or rhinorrhea.     Right Sinus: No maxillary sinus tenderness or frontal sinus tenderness.     Left Sinus: No maxillary sinus tenderness or frontal sinus  tenderness.     Mouth/Throat:     Mouth: Mucous membranes are moist. No oral lesions.     Dentition: Normal dentition.     Tongue: No lesions.     Palate: No mass and lesions.     Pharynx: Oropharynx is clear. Uvula midline. No pharyngeal swelling, oropharyngeal exudate, posterior oropharyngeal erythema or uvula swelling.     Tonsils: No tonsillar exudate or tonsillar abscesses. 1+ on the right. 1+ on the left.  Eyes:     General: No visual field deficit.       Right eye: No discharge.        Left eye: No discharge.     Extraocular Movements: Extraocular movements intact.     Conjunctiva/sclera: Conjunctivae normal.     Pupils: Pupils are equal, round, and reactive to light.  Cardiovascular:     Rate and Rhythm: Normal rate and regular rhythm.     Pulses: Normal pulses.     Heart sounds: Normal heart sounds. No murmur heard. No friction rub. No gallop.   Pulmonary:     Effort: Pulmonary effort is normal. No respiratory distress.     Breath sounds: No stridor. Rhonchi present. No wheezing or rales.  Chest:     Chest wall: No tenderness.  Abdominal:     General: Abdomen is flat. Bowel sounds are normal. There is no distension.     Palpations: Abdomen is soft.     Tenderness: There is no abdominal tenderness. There is no right CVA tenderness or left CVA tenderness.  Musculoskeletal:        General: No swelling or tenderness. Normal range of motion.     Cervical back: Normal range of motion. No rigidity or tenderness.     Right lower leg: No edema.     Left lower leg: No edema.  Lymphadenopathy:     Cervical: No cervical adenopathy.  Skin:    General: Skin is warm and dry.     Capillary Refill: Capillary refill takes less than 2 seconds.     Findings: No erythema or rash.  Neurological:     General: No focal deficit present.     Mental Status: He is alert and oriented to person, place, and time.     Cranial Nerves: Cranial nerves are intact. No cranial nerve deficit or facial  asymmetry.     Motor: Motor function is intact. No weakness.     Coordination: Coordination is intact.     Gait: Gait is intact. Gait normal.  Psychiatric:        Mood and Affect: Mood normal.     ED Results / Procedures / Treatments   Labs (all labs ordered are listed, but only abnormal results are displayed) Labs Reviewed  RESP PANEL BY RT-PCR (FLU A&B, COVID) [id number]    EKG None  Radiology DG Chest Portable 1 View  Result Date: 11/08/2020  CLINICAL DATA:  Pt with productive cough x 7 days. Pt states hx of bronchitis. Fevers at home. No previous surgeries to chest. Current everyday smoker. EXAM: PORTABLE CHEST 1 VIEW COMPARISON:  Chest radiograph 11/27/2017 FINDINGS: Cardiomediastinal contours are within normal limits. New focal opacity in the right mid/lower lung concerning for infection. Left lung is clear. No pneumothorax or large pleural effusion. No acute finding in the visualized skeleton. IMPRESSION: New focal opacity in the right mid/lower lung concerning for infection. Recommend follow-up radiograph in 3-4 weeks to ensure resolution. Electronically Signed   By: Emmaline Kluver M.D.   On: 11/08/2020 17:27    Procedures Procedures (including critical care time)  Medications Ordered in ED Medications  albuterol (VENTOLIN HFA) 108 (90 Base) MCG/ACT inhaler 1 puff (1 puff Inhalation Given 11/08/20 1811)    ED Course  I have reviewed the triage vital signs and the nursing notes.  Pertinent labs & imaging results that were available during my care of the patient were reviewed by me and considered in my medical decision making (see chart for details).    MDM Rules/Calculators/A&P                         Tyrone Singleton is a 39 y.o. male with pertinent past medical history of bronchitis that presents the emergency department today for cough.  Patient is not in respiratory distress, normal vital signs, will obtain Covid swab at this time and chest x-ray.  Covid swab  negative, chest x-ray suspicious for mid lobar pneumonia on the right side.  Presentation does fit for CAP with fevers, cough and chest x-ray findings.  Will have patient start antibiotics and follow-up with PCP.  Did also refill patient's albuterol inhaler.  Patient comfortable with discharge, do not think we need labs at this time with normal vitals and healthy 39 year old male in no acute distress.  Doubt need for further emergent work up at this time. I explained the diagnosis and have given explicit precautions to return to the ER including for any other new or worsening symptoms. The patient understands and accepts the medical plan as it's been dictated and I have answered their questions. Discharge instructions concerning home care and prescriptions have been given. The patient is STABLE and is discharged to home in good condition.  I discussed this case with my attending physician who cosigned this note including patient's presenting symptoms, physical exam, and planned diagnostics and interventions. Attending physician stated agreement with plan or made changes to plan which were implemented.   Final Clinical Impression(s) / ED Diagnoses Final diagnoses:  Community acquired pneumonia of right middle lobe of lung    Rx / DC Orders ED Discharge Orders         Ordered    azithromycin (ZITHROMAX) 250 MG tablet  Daily        11/08/20 1806           Farrel Gordon, PA-C 11/08/20 1834    Eber Hong, MD 11/10/20 [phone]

## 2020-11-08 NOTE — Discharge Instructions (Signed)
Your Covid and flu test were negative today.  You do have pneumonia, we will give you antibiotics for this.  In regards to your fevers I want you to take your Tylenol and alternate between ibuprofen as directed on the bottle.  Please stay hydrated.  Please use the attached instructions.  If you have any shortness of breath or difficulty breathing you need to come back to the emergency department.  I want you to follow-up with your primary care in the next couple of days, if you do not have one you can go to [facility] health community health wellness.  Please speak to pharmacist about any side effects or interactions to medications that were prescribed today.  I do want to get a repeat chest x-ray in 4 weeks to make sure that this is resolved.

## 2021-11-18 ENCOUNTER — Other Ambulatory Visit: Payer: Self-pay

## 2021-11-18 ENCOUNTER — Encounter: Payer: Self-pay | Admitting: Emergency Medicine

## 2021-11-18 ENCOUNTER — Ambulatory Visit
Admission: EM | Admit: 2021-11-18 | Discharge: 2021-11-18 | Disposition: A | Discharge status: Home / Self Care | Payer: Self-pay | Attending: Family Medicine | Admitting: Family Medicine

## 2021-11-18 DIAGNOSIS — J069 Acute upper respiratory infection, unspecified: Secondary | ICD-10-CM

## 2021-11-18 DIAGNOSIS — J4521 Mild intermittent asthma with (acute) exacerbation: Secondary | ICD-10-CM

## 2021-11-18 MED ORDER — ALBUTEROL SULFATE HFA 108 (90 BASE) MCG/ACT IN AERS: 2.0000 | INHALATION_SPRAY | RESPIRATORY_TRACT | 0 refills | Status: AC | PRN

## 2021-11-18 MED ORDER — PROMETHAZINE-DM 6.25-15 MG/5ML PO SYRP: 5.0000 mL | ORAL_SOLUTION | Freq: Four times a day (QID) | ORAL | 0 refills | Status: AC | PRN

## 2021-11-18 MED ORDER — PREDNISONE 20 MG PO TABS: 40.0000 mg | ORAL_TABLET | Freq: Every day | ORAL | 0 refills | Status: AC

## 2021-11-18 NOTE — ED Triage Notes (Signed)
Productive cough with green sputum since Friday.

## 2021-11-18 NOTE — ED Provider Notes (Signed)
[facility]    CSN: [id number] Arrival date & time: 11/18/21  0941      History   Chief Complaint No chief complaint on file.   HPI Tyrone Singleton is a 40 y.o. male.   Presenting today with productive cough, wheezing, chest tightness x5 days.  He denies fever, chills, sore throat, congestion, abdominal pain, nausea vomiting or diarrhea.  So far trying cold and flu medication, cough drops with minimal temporary relief.  History of asthma not currently on any medications, bronchitis and had pneumonia last winter.  No known sick contacts recently.   Past Medical History:  Diagnosis Date   Bronchitis     There are no problems to display for this patient.   History reviewed. No pertinent surgical history.     Home Medications    Prior to Admission medications   Medication Sig Start Date End Date Taking? Authorizing Provider  albuterol (VENTOLIN HFA) 108 (90 Base) MCG/ACT inhaler Inhale 2 puffs into the lungs every 4 (four) hours as needed for wheezing or shortness of breath. 11/18/21  Yes Particia Nearing, PA-C  predniSONE (DELTASONE) 20 MG tablet Take 2 tablets (40 mg total) by mouth daily with breakfast. 11/18/21  Yes Particia Nearing, PA-C  promethazine-dextromethorphan (PROMETHAZINE-DM) 6.25-15 MG/5ML syrup Take 5 mLs by mouth 4 (four) times daily as needed. 11/18/21  Yes Particia Nearing, PA-C  azithromycin (ZITHROMAX) 250 MG tablet Take 1 tablet (250 mg total) by mouth daily. Take first 2 tablets together, then 1 every day until finished. 11/08/20   Farrel Gordon, PA-C  benzonatate (TESSALON PERLES) 100 MG capsule Take 1 capsule (100 mg total) by mouth 3 (three) times daily as needed for cough. 11/27/17   Mackuen, Courteney Lyn, MD  predniSONE (DELTASONE) 10 MG tablet Take 6 tablets daily for 3 additional days. 05/30/17   Burgess Amor, PA-C    Family History History reviewed. No pertinent family history.  Social History Social History    Tobacco Use   Smoking status: Every Day    Packs/day: 0.50    Types: Cigarettes   Smokeless tobacco: Never  Vaping Use   Vaping Use: Never used  Substance Use Topics   Alcohol use: Yes    Comment: occasionally   Drug use: No     Allergies   Patient has no known allergies.   Review of Systems Review of Systems Per HPI  Physical Exam Triage Vital Signs ED Triage Vitals  Enc Vitals Group     BP 11/18/21 0955 129/83     Pulse Rate 11/18/21 0955 78     Resp 11/18/21 0955 18     Temp 11/18/21 0955 98.5 F (36.9 C)     Temp Source 11/18/21 0955 Oral     SpO2 11/18/21 0955 98 %     Weight --      Height --      Head Circumference --      Peak Flow --      Pain Score 11/18/21 0957 0     Pain Loc --      Pain Edu? --      Excl. in GC? --    No data found.  Updated Vital Signs BP 129/83 (BP Location: Right Arm)    Pulse 78    Temp 98.5 F (36.9 C) (Oral)    Resp 18    SpO2 98%   Visual Acuity Right Eye Distance:   Left Eye Distance:   Bilateral Distance:  Right Eye Near:   Left Eye Near:    Bilateral Near:     Physical Exam Vitals and nursing note reviewed.  Constitutional:      Appearance: He is well-developed.  HENT:     Head: Atraumatic.     Right Ear: External ear normal.     Left Ear: External ear normal.     Nose: Nose normal.     Mouth/Throat:     Pharynx: Posterior oropharyngeal erythema present. No oropharyngeal exudate.  Eyes:     Conjunctiva/sclera: Conjunctivae normal.     Pupils: Pupils are equal, round, and reactive to light.  Cardiovascular:     Rate and Rhythm: Normal rate and regular rhythm.  Pulmonary:     Effort: Pulmonary effort is normal. No respiratory distress.     Breath sounds: Wheezing present. No rales.  Musculoskeletal:        General: Normal range of motion.     Cervical back: Normal range of motion and neck supple.  Lymphadenopathy:     Cervical: No cervical adenopathy.  Skin:    General: Skin is warm and dry.   Neurological:     Mental Status: He is alert and oriented to person, place, and time.  Psychiatric:        Behavior: Behavior normal.     UC Treatments / Results  Labs (all labs ordered are listed, but only abnormal results are displayed) Labs Reviewed - No data to display  EKG   Radiology No results found.  Procedures Procedures (including critical care time)  Medications Ordered in UC Medications - No data to display  Initial Impression / Assessment and Plan / UC Course  I have reviewed the triage vital signs and the nursing notes.  Pertinent labs & imaging results that were available during my care of the patient were reviewed by me and considered in my medical decision making (see chart for details).     Suspect asthma exacerbation secondary to viral upper respiratory infection.  We will treat with prednisone, albuterol inhaler, Phenergan DM.  Discussed abortive over-the-counter medications and home care.  Work note given.  Return for acutely worsening symptoms.  Final Clinical Impressions(s) / UC Diagnoses   Final diagnoses:  Upper respiratory tract infection, unspecified type  Mild intermittent asthma with acute exacerbation   Discharge Instructions   None    ED Prescriptions     Medication Sig Dispense Auth. Provider   predniSONE (DELTASONE) 20 MG tablet Take 2 tablets (40 mg total) by mouth daily with breakfast. 10 tablet Particia Nearing, PA-C   albuterol (VENTOLIN HFA) 108 (90 Base) MCG/ACT inhaler Inhale 2 puffs into the lungs every 4 (four) hours as needed for wheezing or shortness of breath. 18 g Roosvelt Maser [place], [place]   promethazine-dextromethorphan (PROMETHAZINE-DM) 6.25-15 MG/5ML syrup Take 5 mLs by mouth 4 (four) times daily as needed. 100 mL Particia Nearing, [place]      PDMP not reviewed this encounter.   Particia Nearing, [place] 11/18/21 1027

## 2021-11-20 ENCOUNTER — Other Ambulatory Visit: Payer: Self-pay

## 2021-11-20 ENCOUNTER — Emergency Department (HOSPITAL_COMMUNITY)
Admission: EM | Admit: 2021-11-20 | Discharge: 2021-11-20 | Disposition: A | Discharge status: Home / Self Care | Payer: Self-pay | Attending: Emergency Medicine | Admitting: Emergency Medicine

## 2021-11-20 ENCOUNTER — Encounter (HOSPITAL_COMMUNITY): Payer: Self-pay | Admitting: *Deleted

## 2021-11-20 DIAGNOSIS — F17210 Nicotine dependence, cigarettes, uncomplicated: Secondary | ICD-10-CM | POA: Insufficient documentation

## 2021-11-20 DIAGNOSIS — Z20822 Contact with and (suspected) exposure to covid-19: Secondary | ICD-10-CM | POA: Insufficient documentation

## 2021-11-20 DIAGNOSIS — J029 Acute pharyngitis, unspecified: Secondary | ICD-10-CM | POA: Insufficient documentation

## 2021-11-20 LAB — RESP PANEL BY RT-PCR (FLU A&B, COVID) ARPGX2
Influenza A by PCR: NEGATIVE
Influenza B by PCR: NEGATIVE
SARS Coronavirus 2 by RT PCR: NEGATIVE

## 2021-11-20 LAB — GROUP A STREP BY PCR: Group A Strep by PCR: NOT DETECTED

## 2021-11-20 MED ORDER — DEXAMETHASONE SODIUM PHOSPHATE 10 MG/ML IJ SOLN
10.0000 mg | Freq: Once | INTRAMUSCULAR | Status: AC
  Administered 2021-11-20: 23:00:00 10 mg via INTRAMUSCULAR
  Filled 2021-11-20: qty 1

## 2021-11-20 NOTE — Discharge Instructions (Signed)
You are seen in the emergency room today with sore throat.  Your strep, COVID, flu test all came back negative.  Please continue your steroids given at the urgent care 2 days ago.  Drink plenty of fluids and get some rest.

## 2021-11-20 NOTE — ED Triage Notes (Signed)
Pt with sore throat since lunch time today.

## 2021-11-20 NOTE — ED Provider Notes (Signed)
Emergency Department Provider Note   I have reviewed the triage vital signs and the nursing notes.   HISTORY  Chief Complaint Sore Throat   HPI Tyrone Singleton is a 40 y.o. male with past history reviewed below presents emergency department for evaluation of sore throat with cough.  He states mainly his throat is been hurting since around lunch today.  He is developed some mild coughing symptoms.  Denies any chest pain or shortness of breath.  No abdominal pain, vomiting, diarrhea.  He was evaluated at the urgent care 2 nights ago with at that time what he reported was 5 days of flulike symptoms.  He received prednisone, albuterol, cough medication.  Noted to have a history of asthma during the visit.    Past Medical History:  Diagnosis Date   Bronchitis     There are no problems to display for this patient.   History reviewed. No pertinent surgical history.  Allergies Patient has no known allergies.  History reviewed. No pertinent family history.  Social History Social History   Tobacco Use   Smoking status: Every Day    Packs/day: 0.50    Types: Cigarettes   Smokeless tobacco: Never  Vaping Use   Vaping Use: Never used  Substance Use Topics   Alcohol use: Yes    Comment: occasionally   Drug use: No    Review of Systems  Constitutional: No fever/chills Eyes: No visual changes. ENT: Positive sore throat. Cardiovascular: Denies chest pain. Respiratory: Denies shortness of breath. Mild cough.  Gastrointestinal: No abdominal pain.  No nausea, no vomiting.  No diarrhea.  No constipation. Genitourinary: Negative for dysuria. Musculoskeletal: Negative for back pain. Skin: Negative for rash. Neurological: Negative for headaches, focal weakness or numbness.  10-point ROS otherwise negative.  ____________________________________________   PHYSICAL EXAM:  VITAL SIGNS: ED Triage Vitals  Enc Vitals Group     BP 11/20/21 2117 138/87     Pulse Rate  11/20/21 2117 85     Resp 11/20/21 2117 18     Temp 11/20/21 2117 (!) 97 F (36.1 C)     Temp Source 11/20/21 2117 Temporal     SpO2 11/20/21 2117 95 %     Weight 11/20/21 2116 185 lb (83.9 kg)     Height 11/20/21 2116 6' (1.829 m)   Constitutional: Alert and oriented. Well appearing and in no acute distress. Eyes: Conjunctivae are normal.  Head: Atraumatic. Nose: No congestion/rhinnorhea. Mouth/Throat: Mucous membranes are moist.  Oropharynx with mild erythema. No exudate. No PTA.  Neck: No stridor.   Cardiovascular: Normal rate, regular rhythm. Good peripheral circulation. Grossly normal heart sounds.   Respiratory: Normal respiratory effort.  No retractions. Lungs CTAB. Gastrointestinal: Soft and nontender. No distention.  Musculoskeletal: No lower extremity tenderness nor edema. No gross deformities of extremities. Neurologic:  Normal speech and language. No gross focal neurologic deficits are appreciated.  Skin:  Skin is warm, dry and intact. No rash noted.   ____________________________________________   LABS (all labs ordered are listed, but only abnormal results are displayed)  Labs Reviewed  RESP PANEL BY RT-PCR (FLU A&B, COVID) ARPGX2  GROUP A STREP BY PCR   ____________________________________________   PROCEDURES  Procedure(s) performed:   Procedures  None  ____________________________________________   INITIAL IMPRESSION / ASSESSMENT AND PLAN / ED COURSE  Pertinent labs & imaging results that were available during my care of the patient were reviewed by me and considered in my medical decision making (see chart for  details).   Patient presents to the emergency department with sore throat starting today along with cough.  Lungs are clear with no wheezing.  Oropharynx is widely patent.  No peritonsillar abscess.  No trismus.  No hard signs to suspect deeper space neck infection.  Plan for strep and COVID testing with reassess.   COVID and Flu negative.  Patient cannot afford the steroids ordered at the UC visit. Will give IM decadron here.  ____________________________________________  FINAL CLINICAL IMPRESSION(S) / ED DIAGNOSES  Final diagnoses:  Viral pharyngitis     MEDICATIONS GIVEN DURING THIS VISIT:  Medications  dexamethasone (DECADRON) injection 10 mg (10 mg Intramuscular Given 11/20/21 2253)    Note:  This document was prepared using Dragon voice recognition software and may include unintentional dictation errors.  Alona Bene, MD, [facility]    Tarshia Kot, Arlyss Repress, MD 11/20/21 [phone]

## 2022-04-12 IMAGING — DX DG CHEST 1V PORT
1 series · 1 of 1 positions shown · non-contrast
Comparison: Chest radiograph 11/27/2017

CLINICAL DATA: Pt with productive cough x 7 days. Pt states hx of
bronchitis. Fevers at home. No previous surgeries to chest. Current
everyday smoker.

EXAM:
PORTABLE CHEST 1 VIEW

[chest ap]
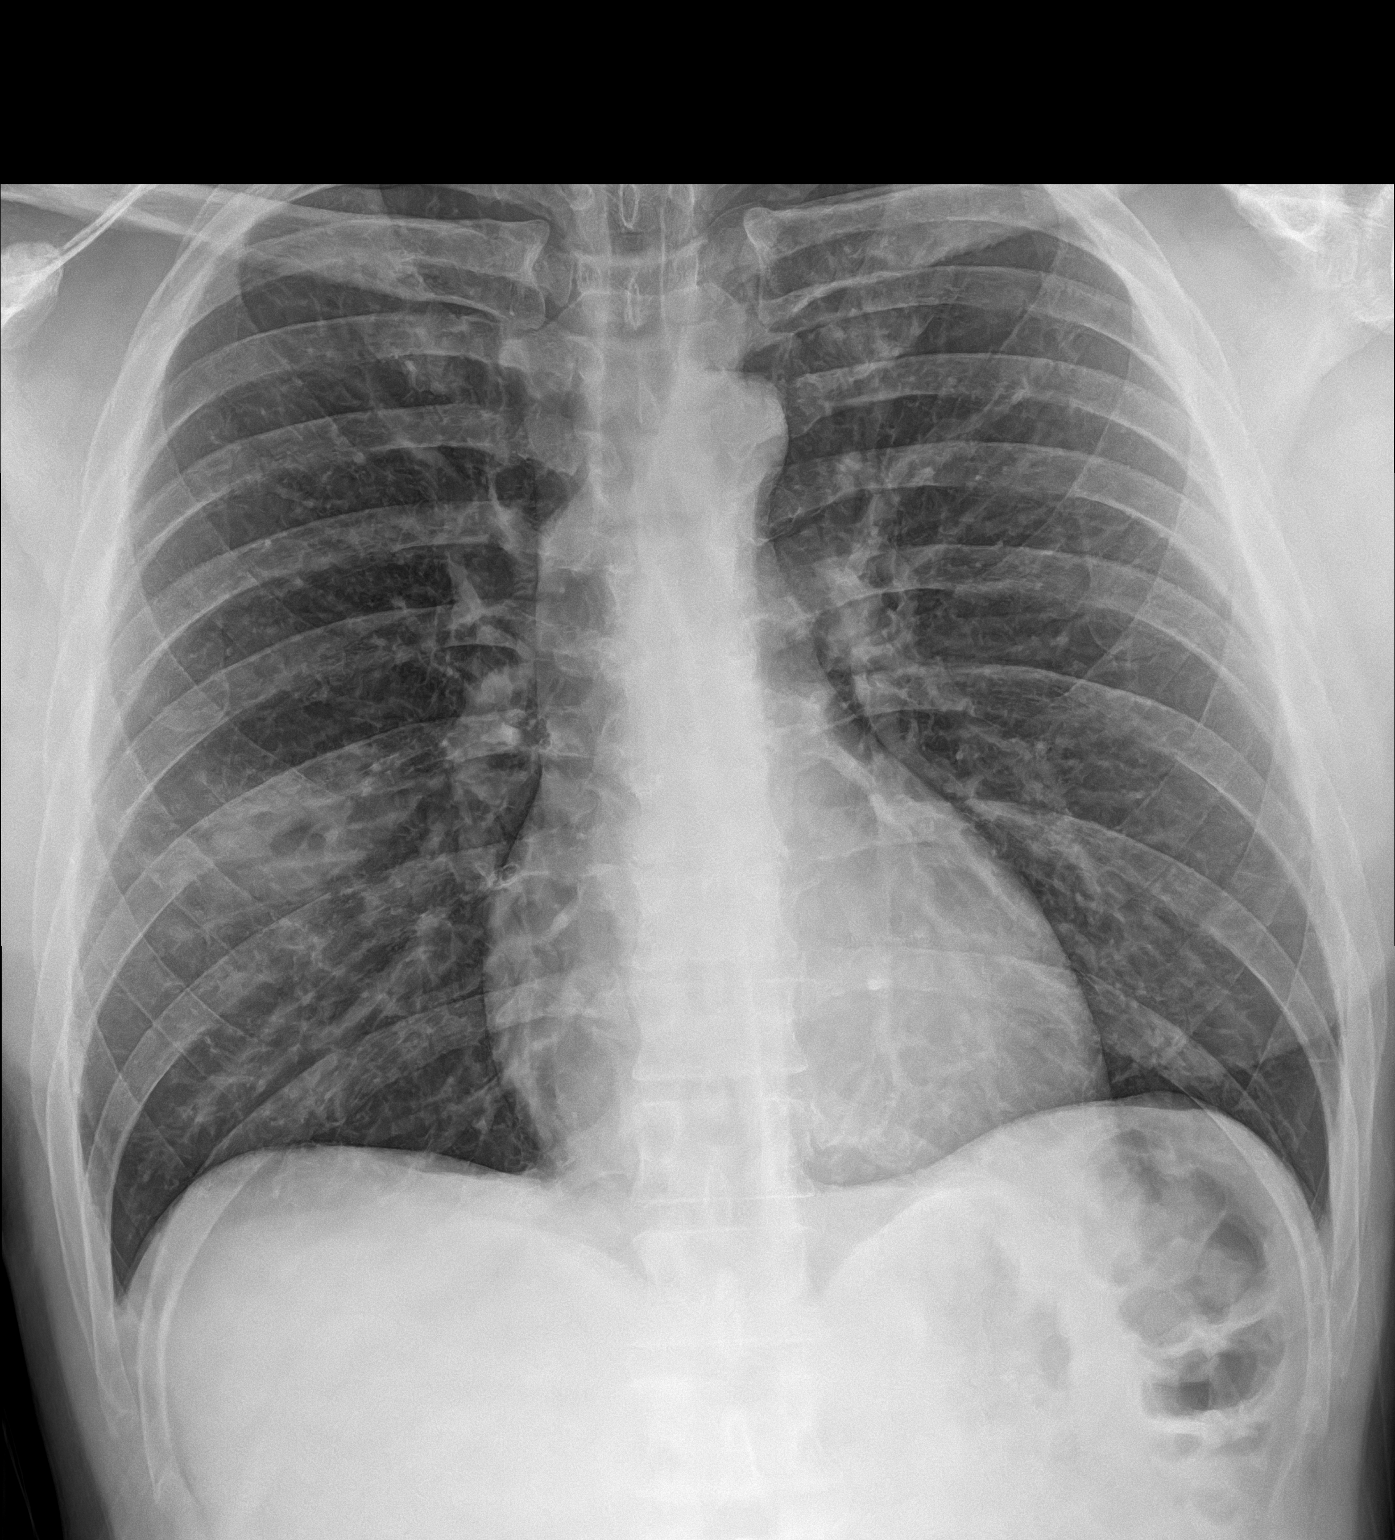

[1 of 1 positions shown; findings below may reference images not displayed]

FINDINGS: Cardiomediastinal contours are within normal limits. New focal
opacity in the right mid/lower lung concerning for infection. Left
lung is clear. No pneumothorax or large pleural effusion. No acute
finding in the visualized skeleton.
IMPRESSION: New focal opacity in the right mid/lower lung concerning for
infection. Recommend follow-up radiograph in 3-4 weeks to ensure
resolution.

## 2022-08-21 ENCOUNTER — Emergency Department (HOSPITAL_COMMUNITY)
Admission: EM | Admit: 2022-08-21 | Discharge: 2022-08-21 | Disposition: A | Discharge status: Home / Self Care | Payer: Self-pay | Attending: Emergency Medicine | Admitting: Emergency Medicine

## 2022-08-21 ENCOUNTER — Encounter (HOSPITAL_COMMUNITY): Payer: Self-pay | Admitting: Emergency Medicine

## 2022-08-21 ENCOUNTER — Other Ambulatory Visit: Payer: Self-pay

## 2022-08-21 DIAGNOSIS — K0889 Other specified disorders of teeth and supporting structures: Secondary | ICD-10-CM | POA: Insufficient documentation

## 2022-08-21 MED ORDER — KETOROLAC TROMETHAMINE 15 MG/ML IJ SOLN
15.0000 mg | Freq: Once | INTRAMUSCULAR | Status: AC
  Administered 2022-08-21: 15 mg via INTRAMUSCULAR
  Filled 2022-08-21: qty 1

## 2022-08-21 MED ORDER — IBUPROFEN 600 MG PO TABS: 600.0000 mg | ORAL_TABLET | Freq: Four times a day (QID) | ORAL | 0 refills | Status: AC | PRN

## 2022-08-21 MED ORDER — AMOXICILLIN-POT CLAVULANATE 875-125 MG PO TABS
1.0000 | ORAL_TABLET | Freq: Once | ORAL | Status: AC
  Administered 2022-08-21: 1 via ORAL
  Filled 2022-08-21: qty 1

## 2022-08-21 MED ORDER — AMOXICILLIN-POT CLAVULANATE 875-125 MG PO TABS: 1.0000 | ORAL_TABLET | Freq: Two times a day (BID) | ORAL | 0 refills | Status: AC

## 2022-08-21 NOTE — ED Triage Notes (Signed)
Patient c/o left upper dental pain with swelling x2 days that is progressively getting worse. Unsure of any fevers but reports having "hit sweats yesterday." Per patient some broken teeth. Denies taking anything for pain.

## 2022-08-21 NOTE — ED Provider Notes (Signed)
[facility] EMERGENCY DEPARTMENT Provider Note   CSN: [id number] Arrival date & time: 08/21/22  1754     History Chief Complaint  Patient presents with   Dental Pain    Tyrone Singleton is a 41 y.o. male patient who presents to the emergency department today for further evaluation of dental pain for the last 24 hours.  Patient has a broken tooth and it started hurting a little more than normal.  He reports associated facial swelling but denies fever or chills.  He is able to drink although it is painful.  Does not have a dentist.   Dental Pain      Home Medications Prior to Admission medications   Medication Sig Start Date End Date Taking? Authorizing Provider  ibuprofen (ADVIL) 600 MG tablet Take 1 tablet (600 mg total) by mouth every 6 (six) hours as needed. 08/21/22  Yes Raul Del, Wymon Swaney M, PA-C  albuterol (VENTOLIN HFA) 108 (90 Base) MCG/ACT inhaler Inhale 2 puffs into the lungs every 4 (four) hours as needed for wheezing or shortness of breath. 11/18/21   Volney American, PA-C  azithromycin (ZITHROMAX) 250 MG tablet Take 1 tablet (250 mg total) by mouth daily. Take first 2 tablets together, then 1 every day until finished. 11/08/20   Alfredia Client, PA-C  benzonatate (TESSALON PERLES) 100 MG capsule Take 1 capsule (100 mg total) by mouth 3 (three) times daily as needed for cough. 11/27/17   Mackuen, Courteney Lyn, MD  predniSONE (DELTASONE) 10 MG tablet Take 6 tablets daily for 3 additional days. 05/30/17   Evalee Jefferson, PA-C  predniSONE (DELTASONE) 20 MG tablet Take 2 tablets (40 mg total) by mouth daily with breakfast. 11/18/21   Volney American, PA-C  promethazine-dextromethorphan (PROMETHAZINE-DM) 6.25-15 MG/5ML syrup Take 5 mLs by mouth 4 (four) times daily as needed. 11/18/21   Volney American, PA-C      Allergies    Patient has no known allergies.    Review of Systems   Review of Systems  All other systems reviewed and are negative.   Physical  Exam Updated Vital Signs BP 122/85 (BP Location: Left Arm)   Pulse 75   Temp 98.4 F (36.9 C) (Oral)   Resp 18   Ht 6' (1.829 m)   Wt 82.1 kg   SpO2 95%   BMI 24.55 kg/m  Physical Exam Vitals and nursing note reviewed.  Constitutional:      Appearance: Normal appearance.  HENT:     Head: Normocephalic and atraumatic.     Mouth/Throat:   Eyes:     General:        Right eye: No discharge.        Left eye: No discharge.     Conjunctiva/sclera: Conjunctivae normal.  Pulmonary:     Effort: Pulmonary effort is normal.  Skin:    General: Skin is warm and dry.     Findings: No rash.  Neurological:     General: No focal deficit present.     Mental Status: He is alert.  Psychiatric:        Mood and Affect: Mood normal.        Behavior: Behavior normal.     ED Results / Procedures / Treatments   Labs (all labs ordered are listed, but only abnormal results are displayed) Labs Reviewed - No data to display  EKG None  Radiology No results found.  Procedures Procedures    Medications Ordered in ED Medications  ketorolac (TORADOL)  15 MG/ML injection 15 mg (has no administration in time range)  amoxicillin-clavulanate (AUGMENTIN) 875-125 MG per tablet 1 tablet (has no administration in time range)    ED Course/ Medical Decision Making/ A&P                           Medical Decision Making Tyrone Singleton is a 41 y.o. male patient who presents to the emergency department today for further evaluation of dental pain.  I have a low suspicion for any obvious periodontal abscess, RPA, PTA.  I have a low suspicion for Ludwig's angina.  I will prescribe him antibiotics and pain medication.  I have also given him referral for dentist.  Strict return precautions were discussed.  He is safe for discharge at this time.   Risk Prescription drug management.   Final Clinical Impression(s) / ED Diagnoses Final diagnoses:  Pain, dental    Rx / DC Orders ED Discharge Orders           Ordered    ibuprofen (ADVIL) 600 MG tablet  Every 6 hours PRN        08/21/22 1852              Teressa Lower, Cordelia Poche 08/21/22 Orlinda Blalock, MD 08/21/22 2000

## 2022-08-21 NOTE — Discharge Instructions (Signed)
Please take 600 mg ibuprofen every 6 hours as needed for pain.  Please follow-up with dentistry.  Take antibiotics as prescribed.  Return to the emergency department for any worsening symptoms you might have.
